# Patient Record
Sex: Female | Born: 1935 | Race: White | Hispanic: No | Smoking: Former smoker
Health system: Southern US, Community
[De-identification: ages and names within clinical notes are randomized; demographics above are authoritative.]

## PROBLEM LIST (undated history)

## (undated) DIAGNOSIS — E039 Hypothyroidism, unspecified: Secondary | ICD-10-CM

## (undated) DIAGNOSIS — I10 Essential (primary) hypertension: Secondary | ICD-10-CM

## (undated) HISTORY — DX: Hypothyroidism, unspecified: E03.9

## (undated) HISTORY — PX: ABDOMINAL HYSTERECTOMY: SHX81

## (undated) HISTORY — DX: Essential (primary) hypertension: I10

---

## 2004-11-27 ENCOUNTER — Ambulatory Visit: Payer: Self-pay | Admitting: Internal Medicine

## 2005-11-29 ENCOUNTER — Ambulatory Visit: Payer: Self-pay | Admitting: Internal Medicine

## 2006-12-05 ENCOUNTER — Ambulatory Visit: Payer: Self-pay | Admitting: Internal Medicine

## 2007-12-11 ENCOUNTER — Ambulatory Visit: Payer: Self-pay | Admitting: Internal Medicine

## 2008-12-23 ENCOUNTER — Ambulatory Visit: Payer: Self-pay | Admitting: Internal Medicine

## 2009-12-27 ENCOUNTER — Ambulatory Visit: Payer: Self-pay | Admitting: Internal Medicine

## 2011-01-18 ENCOUNTER — Ambulatory Visit: Payer: Self-pay | Admitting: Internal Medicine

## 2012-02-18 ENCOUNTER — Ambulatory Visit: Payer: Self-pay | Admitting: Internal Medicine

## 2013-02-18 ENCOUNTER — Ambulatory Visit: Payer: Self-pay | Admitting: Internal Medicine

## 2014-02-19 ENCOUNTER — Ambulatory Visit: Payer: Self-pay | Admitting: Internal Medicine

## 2014-07-30 DIAGNOSIS — Z86018 Personal history of other benign neoplasm: Secondary | ICD-10-CM

## 2014-07-30 HISTORY — DX: Personal history of other benign neoplasm: Z86.018

## 2015-01-28 ENCOUNTER — Other Ambulatory Visit: Payer: Self-pay | Admitting: Internal Medicine

## 2015-01-28 DIAGNOSIS — Z1231 Encounter for screening mammogram for malignant neoplasm of breast: Secondary | ICD-10-CM

## 2015-02-28 ENCOUNTER — Other Ambulatory Visit: Payer: Self-pay | Admitting: Internal Medicine

## 2015-02-28 ENCOUNTER — Ambulatory Visit
Admission: RE | Admit: 2015-02-28 | Discharge: 2015-02-28 | Disposition: A | Discharge status: Home / Self Care | Payer: Medicare Other | Source: Ambulatory Visit | Attending: Internal Medicine | Admitting: Internal Medicine

## 2015-02-28 DIAGNOSIS — Z1231 Encounter for screening mammogram for malignant neoplasm of breast: Secondary | ICD-10-CM

## 2016-03-05 ENCOUNTER — Other Ambulatory Visit: Payer: Self-pay | Admitting: Internal Medicine

## 2016-03-05 DIAGNOSIS — Z1231 Encounter for screening mammogram for malignant neoplasm of breast: Secondary | ICD-10-CM

## 2016-04-09 ENCOUNTER — Other Ambulatory Visit: Payer: Self-pay | Admitting: Internal Medicine

## 2016-04-09 ENCOUNTER — Ambulatory Visit
Admission: RE | Admit: 2016-04-09 | Discharge: 2016-04-09 | Disposition: A | Discharge status: Home / Self Care | Payer: Medicare Other | Source: Ambulatory Visit | Attending: Internal Medicine | Admitting: Internal Medicine

## 2016-04-09 DIAGNOSIS — Z1231 Encounter for screening mammogram for malignant neoplasm of breast: Secondary | ICD-10-CM | POA: Insufficient documentation

## 2017-03-19 ENCOUNTER — Other Ambulatory Visit: Payer: Self-pay | Admitting: Internal Medicine

## 2017-03-19 DIAGNOSIS — Z1231 Encounter for screening mammogram for malignant neoplasm of breast: Secondary | ICD-10-CM

## 2017-04-22 ENCOUNTER — Ambulatory Visit
Admission: RE | Admit: 2017-04-22 | Discharge: 2017-04-22 | Disposition: A | Discharge status: Home / Self Care | Payer: Medicare Other | Source: Ambulatory Visit | Attending: Internal Medicine | Admitting: Internal Medicine

## 2017-04-22 DIAGNOSIS — Z1231 Encounter for screening mammogram for malignant neoplasm of breast: Secondary | ICD-10-CM | POA: Insufficient documentation

## 2017-04-25 ENCOUNTER — Other Ambulatory Visit: Payer: Self-pay | Admitting: Internal Medicine

## 2017-04-25 DIAGNOSIS — N6320 Unspecified lump in the left breast, unspecified quadrant: Secondary | ICD-10-CM

## 2017-04-25 DIAGNOSIS — R928 Other abnormal and inconclusive findings on diagnostic imaging of breast: Secondary | ICD-10-CM

## 2017-04-30 ENCOUNTER — Ambulatory Visit
Admission: RE | Admit: 2017-04-30 | Discharge: 2017-04-30 | Disposition: A | Discharge status: Home / Self Care | Payer: Medicare Other | Source: Ambulatory Visit | Attending: Internal Medicine | Admitting: Internal Medicine

## 2017-04-30 ENCOUNTER — Other Ambulatory Visit: Payer: Self-pay | Admitting: Internal Medicine

## 2017-04-30 DIAGNOSIS — N6320 Unspecified lump in the left breast, unspecified quadrant: Secondary | ICD-10-CM | POA: Diagnosis present

## 2017-04-30 DIAGNOSIS — R928 Other abnormal and inconclusive findings on diagnostic imaging of breast: Secondary | ICD-10-CM

## 2017-10-10 ENCOUNTER — Other Ambulatory Visit: Payer: Self-pay | Admitting: Internal Medicine

## 2017-10-10 DIAGNOSIS — N6320 Unspecified lump in the left breast, unspecified quadrant: Secondary | ICD-10-CM

## 2017-11-13 ENCOUNTER — Ambulatory Visit
Admission: RE | Admit: 2017-11-13 | Discharge: 2017-11-13 | Disposition: A | Discharge status: Home / Self Care | Payer: Medicare Other | Source: Ambulatory Visit | Attending: Internal Medicine | Admitting: Internal Medicine

## 2017-11-13 DIAGNOSIS — N6321 Unspecified lump in the left breast, upper outer quadrant: Secondary | ICD-10-CM | POA: Diagnosis not present

## 2017-11-13 DIAGNOSIS — N6320 Unspecified lump in the left breast, unspecified quadrant: Secondary | ICD-10-CM | POA: Diagnosis present

## 2017-11-14 ENCOUNTER — Other Ambulatory Visit: Payer: Self-pay | Admitting: Internal Medicine

## 2017-11-18 ENCOUNTER — Other Ambulatory Visit: Payer: Self-pay | Admitting: Internal Medicine

## 2017-11-18 DIAGNOSIS — N6320 Unspecified lump in the left breast, unspecified quadrant: Secondary | ICD-10-CM

## 2017-11-18 DIAGNOSIS — R928 Other abnormal and inconclusive findings on diagnostic imaging of breast: Secondary | ICD-10-CM

## 2017-11-20 ENCOUNTER — Ambulatory Visit
Admission: RE | Admit: 2017-11-20 | Discharge: 2017-11-20 | Disposition: A | Discharge status: Home / Self Care | Payer: Medicare Other | Source: Ambulatory Visit | Attending: Internal Medicine | Admitting: Internal Medicine

## 2017-11-20 DIAGNOSIS — N6320 Unspecified lump in the left breast, unspecified quadrant: Secondary | ICD-10-CM

## 2017-11-20 DIAGNOSIS — R928 Other abnormal and inconclusive findings on diagnostic imaging of breast: Secondary | ICD-10-CM | POA: Diagnosis not present

## 2017-11-20 DIAGNOSIS — N6321 Unspecified lump in the left breast, upper outer quadrant: Secondary | ICD-10-CM | POA: Insufficient documentation

## 2017-11-20 DIAGNOSIS — N6092 Unspecified benign mammary dysplasia of left breast: Secondary | ICD-10-CM | POA: Insufficient documentation

## 2017-11-20 HISTORY — PX: BREAST BIOPSY: SHX20

## 2017-11-21 ENCOUNTER — Other Ambulatory Visit: Payer: Self-pay | Admitting: Internal Medicine

## 2017-11-21 DIAGNOSIS — N6320 Unspecified lump in the left breast, unspecified quadrant: Secondary | ICD-10-CM

## 2017-11-21 DIAGNOSIS — R928 Other abnormal and inconclusive findings on diagnostic imaging of breast: Secondary | ICD-10-CM

## 2017-11-22 LAB — SURGICAL PATHOLOGY

## 2017-12-04 ENCOUNTER — Ambulatory Visit
Admission: RE | Admit: 2017-12-04 | Discharge: 2017-12-04 | Disposition: A | Discharge status: Home / Self Care | Payer: Medicare Other | Source: Ambulatory Visit | Attending: Internal Medicine | Admitting: Internal Medicine

## 2017-12-04 DIAGNOSIS — N6320 Unspecified lump in the left breast, unspecified quadrant: Secondary | ICD-10-CM

## 2017-12-04 DIAGNOSIS — R928 Other abnormal and inconclusive findings on diagnostic imaging of breast: Secondary | ICD-10-CM

## 2017-12-04 DIAGNOSIS — D242 Benign neoplasm of left breast: Secondary | ICD-10-CM | POA: Insufficient documentation

## 2017-12-04 HISTORY — PX: BREAST BIOPSY: SHX20

## 2017-12-05 LAB — SURGICAL PATHOLOGY

## 2017-12-10 ENCOUNTER — Encounter: Payer: Self-pay | Admitting: *Deleted

## 2017-12-26 ENCOUNTER — Encounter: Payer: Self-pay | Admitting: General Surgery

## 2017-12-26 ENCOUNTER — Ambulatory Visit: Payer: Medicare Other | Admitting: General Surgery

## 2017-12-26 VITALS — BP 132/74 | HR 76 | Resp 12 | Ht 63.0 in | Wt 164.0 lb

## 2017-12-26 DIAGNOSIS — D242 Benign neoplasm of left breast: Secondary | ICD-10-CM | POA: Diagnosis not present

## 2017-12-26 NOTE — Progress Notes (Signed)
Patient ID: Katie Berg, female   DOB: Jun 23, 1935, 83 y.o.   MRN: [id number]  Chief Complaint  Patient presents with  . Other    breast mass    HPI Katie Berg is a 83 y.o. female who presents for a breast evaluation. The most recent mammogram was done on 11/13/2017 and added views on 11/20/2017 left breast biopsy 12/04/2017. A year ago she had a mammogram and showed something and choice to watch the area.  Patient does perform regular self breast checks and gets regular mammograms done.    HPI  Past Medical History:  Diagnosis Date  . Hypertension   . Hypothyroid     Past Surgical History:  Procedure Laterality Date  . ABDOMINAL HYSTERECTOMY    . BREAST BIOPSY Left   . BREAST BIOPSY Left 12/04/2017   left breast stereo ribbon clip path pending    Family History  Problem Relation Age of Onset  . Cancer Mother   . ALS Mother   . Heart disease Father   . Breast cancer Neg Hx     Social History Social History   Tobacco Use  . Smoking status: Former Smoker    Types: Cigarettes    Last attempt to quit: 10/15/1985    Years since quitting: 32.2  . Smokeless tobacco: Never Used  Substance Use Topics  . Alcohol use: No    Frequency: Never  . Drug use: No    Allergies  Allergen Reactions  . Shellfish Allergy   . Shellfish-Derived Products     Current Outpatient Medications  Medication Sig Dispense Refill  . diltiazem (DILACOR XR) 240 MG 24 hr capsule Take 240 mg by mouth daily.    Marland Kitchen estrogens, conjugated, (PREMARIN) 0.625 MG tablet Take 0.625 mg by mouth daily. Take daily for 21 days then do not take for 7 days.    Marland Kitchen levothyroxine (SYNTHROID, LEVOTHROID) 125 MCG tablet Take 125 mcg by mouth daily before breakfast.    . losartan-hydrochlorothiazide (HYZAAR) 100-12.5 MG tablet Take 1 tablet by mouth daily.    Marland Kitchen omeprazole (PRILOSEC) 20 MG capsule Take 20 mg by mouth daily.    . potassium chloride SA (K-DUR,KLOR-CON) 20 MEQ tablet Take 20 mEq by mouth 2 (two) times daily.      No current facility-administered medications for this visit.     Review of Systems Review of Systems  Blood pressure 132/74, pulse 76, resp. rate 12, height 5\' 3"  (1.6 m), weight 164 lb (74.4 kg).  Physical Exam Physical Exam  Constitutional: She is oriented to person, place, and time. She appears well-developed and well-nourished.  Eyes: Conjunctivae are normal. No scleral icterus.  Neck: Neck supple.  Cardiovascular: Normal rate, regular rhythm and normal heart sounds.  Pulmonary/Chest: Effort normal and breath sounds normal. Right breast exhibits no inverted nipple, no mass, no nipple discharge, no skin change and no tenderness. Left breast exhibits no inverted nipple, no nipple discharge, no skin change and no tenderness.    Lymphadenopathy:    She has no cervical adenopathy.    She has no axillary adenopathy.  Neurological: She is alert and oriented to person, place, and time.  Skin: Skin is warm and dry.    Data Reviewed December 04, 2017 left breast biopsy: DIAGNOSIS:  A. LEFT BREAST, LOWER OUTER QUADRANT; STEREOTACTIC BIOPSY:  - INTRADUCTAL PAPILLOMA, 5 MM.  - FIBROCYSTIC CHANGE.   Ultrasound of the left breast dated November 13, 2017 describes a 0.4 x 0.6 x 0.7 cm hypoechoic lobulated nodule  in the 2 o'clock position 5 cm from the nipple.  BI-RADS-4  Assessment    Small papilloma of the left breast without atypia.    Plan  Options for management reviewed: 1) formal excision versus 2 observation.  With the lesion being 7 mm on ultrasound, 5 mm on biopsy and without atypia, I think observation is very reasonable.  This meets the patient's goals.  The patient has been asked to return to the office in six months with a left breast diagnostic mammogram. The patient is aware to call back for any questions or concerns.   HPI, Physical Exam, Assessment and Plan have been scribed under the direction and in the presence of Hervey Ard, MD.  Gaspar Cola,  CMA  I have completed the exam and reviewed the above documentation for accuracy and completeness.  I agree with the above.  Haematologist has been used and any errors in dictation or transcription are unintentional.  Hervey Ard, M.D., F.A.C.S.  Forest Gleason Byrnett 12/27/2017, 8:19 PM

## 2017-12-26 NOTE — Patient Instructions (Signed)
The patient has been asked to return to the office in six months with a left breast diagnostic mammogram. The patient is aware to call back for any questions or concerns.

## 2017-12-27 DIAGNOSIS — D242 Benign neoplasm of left breast: Secondary | ICD-10-CM | POA: Insufficient documentation

## 2018-04-09 ENCOUNTER — Other Ambulatory Visit: Payer: Self-pay

## 2018-04-09 DIAGNOSIS — D242 Benign neoplasm of left breast: Secondary | ICD-10-CM

## 2018-06-04 ENCOUNTER — Ambulatory Visit
Admission: RE | Admit: 2018-06-04 | Discharge: 2018-06-04 | Disposition: A | Discharge status: Home / Self Care | Payer: Medicare Other | Source: Ambulatory Visit | Attending: General Surgery | Admitting: General Surgery

## 2018-06-04 DIAGNOSIS — D242 Benign neoplasm of left breast: Secondary | ICD-10-CM

## 2018-06-12 ENCOUNTER — Ambulatory Visit: Payer: Medicare Other | Admitting: General Surgery

## 2018-06-12 ENCOUNTER — Encounter: Payer: Self-pay | Admitting: General Surgery

## 2018-06-12 VITALS — BP 124/66 | HR 85 | Resp 18 | Ht 63.0 in | Wt 164.0 lb

## 2018-06-12 DIAGNOSIS — D242 Benign neoplasm of left breast: Secondary | ICD-10-CM | POA: Diagnosis not present

## 2018-06-12 NOTE — Patient Instructions (Addendum)
The patient is aware to call back for any questions or new concerns.  

## 2018-06-12 NOTE — Progress Notes (Signed)
Patient ID: Katie Berg, female   DOB: Feb 15, 1935, 83 y.o.   MRN: [id number]  Chief Complaint  Patient presents with  . Follow-up    HPI Katie Berg is a 83 y.o. female who presents for her follow up breast evaluation. The most recent mammogram was done on 06/04/2018.  The patient was seen in March 2019 after her stereotactic biopsy had identified a papilloma.  We reviewed options for management at that time and elected on observation. Patient does perform regular self breast checks and gets regular mammograms done.  No new breast issues.  HPI  Past Medical History:  Diagnosis Date  . Hypertension   . Hypothyroid     Past Surgical History:  Procedure Laterality Date  . ABDOMINAL HYSTERECTOMY    . BREAST BIOPSY Left 11/20/2017   [place] bx, FOCAL USUAL DUCTAL HYPERPLASIA AND PART OF A DILATED DUCT  . BREAST BIOPSY Left 12/04/2017   left breast stereo ribbon clip, INTRADUCTAL PAPILLOMA    Family History  Problem Relation Age of Onset  . Cancer Mother   . ALS Mother   . Heart disease Father   . Breast cancer Neg Hx     Social History Social History   Tobacco Use  . Smoking status: Former Smoker    Types: Cigarettes    Last attempt to quit: 10/15/1985    Years since quitting: 32.6  . Smokeless tobacco: Never Used  Substance Use Topics  . Alcohol use: No    Frequency: Never  . Drug use: No    Allergies  Allergen Reactions  . Shellfish Allergy   . Shellfish-Derived Products     Current Outpatient Medications  Medication Sig Dispense Refill  . diltiazem (DILACOR XR) 240 MG 24 hr capsule Take 240 mg by mouth daily.    Marland Kitchen estrogens, conjugated, (PREMARIN) 0.625 MG tablet Take 0.625 mg by mouth daily. Take daily for 21 days then do not take for 7 days.    Marland Kitchen levothyroxine (SYNTHROID, LEVOTHROID) 125 MCG tablet Take 125 mcg by mouth daily before breakfast.    . losartan-hydrochlorothiazide (HYZAAR) 100-12.5 MG tablet Take 1 tablet by mouth daily.    Marland Kitchen omeprazole (PRILOSEC) 20  MG capsule Take 20 mg by mouth daily.    . potassium chloride SA (K-DUR,KLOR-CON) 20 MEQ tablet Take 20 mEq by mouth 2 (two) times daily.     No current facility-administered medications for this visit.     Review of Systems Review of Systems  Constitutional: Negative.   Respiratory: Negative.   Cardiovascular: Negative.     Blood pressure 124/66, pulse 85, resp. rate 18, height 5\' 3"  (1.6 m), weight 164 lb (74.4 kg), SpO2 96 %.  Physical Exam Physical Exam  Constitutional: She is oriented to person, place, and time. She appears well-developed and well-nourished.  Eyes: Conjunctivae are normal. No scleral icterus.  Neck: Neck supple.  Cardiovascular: Normal rate, regular rhythm and normal heart sounds.  Pulmonary/Chest: Effort normal and breath sounds normal. Right breast exhibits no inverted nipple, no mass, no nipple discharge, no skin change and no tenderness. Left breast exhibits no inverted nipple, no mass, no nipple discharge, no skin change and no tenderness.  Lymphadenopathy:    She has no cervical adenopathy.    She has no axillary adenopathy.  Neurological: She is alert and oriented to person, place, and time.  Skin: Skin is warm and dry.    Data Reviewed Bilateral diagnostic mammograms dated June 04, 2018 revealed no interval change at the site  of prior biopsy.  BI-RADS-2.  Screening exam in 1 year recommended.  Assessment    Denying breast exam.    Plan  Follow up as needed.   Continue annual screening mammograms with her PCP.  (Due August 2020). The patient is aware to call back for any questions or concerns.    HPI, Physical Exam, Assessment and Plan have been scribed under the direction and in the presence of Robert Bellow, MD. [name], RN  Katie Berg 06/13/2018, 7:05 AM

## 2019-04-21 IMAGING — MG MM DIGITAL DIAGNOSTIC UNILAT*L* W/ TOMO W/ CAD
5 series · 6 of 13 positions shown · non-contrast
Comparison: Previous exam(s).

CLINICAL DATA: Follow-up of probably benign left breast 2 o'clock
nodule.

EXAM:
2D DIGITAL DIAGNOSTIC LEFT MAMMOGRAM WITH CAD AND ADJUNCT TOMO
ULTRASOUND LEFT BREAST

[L MLO synth-2D]
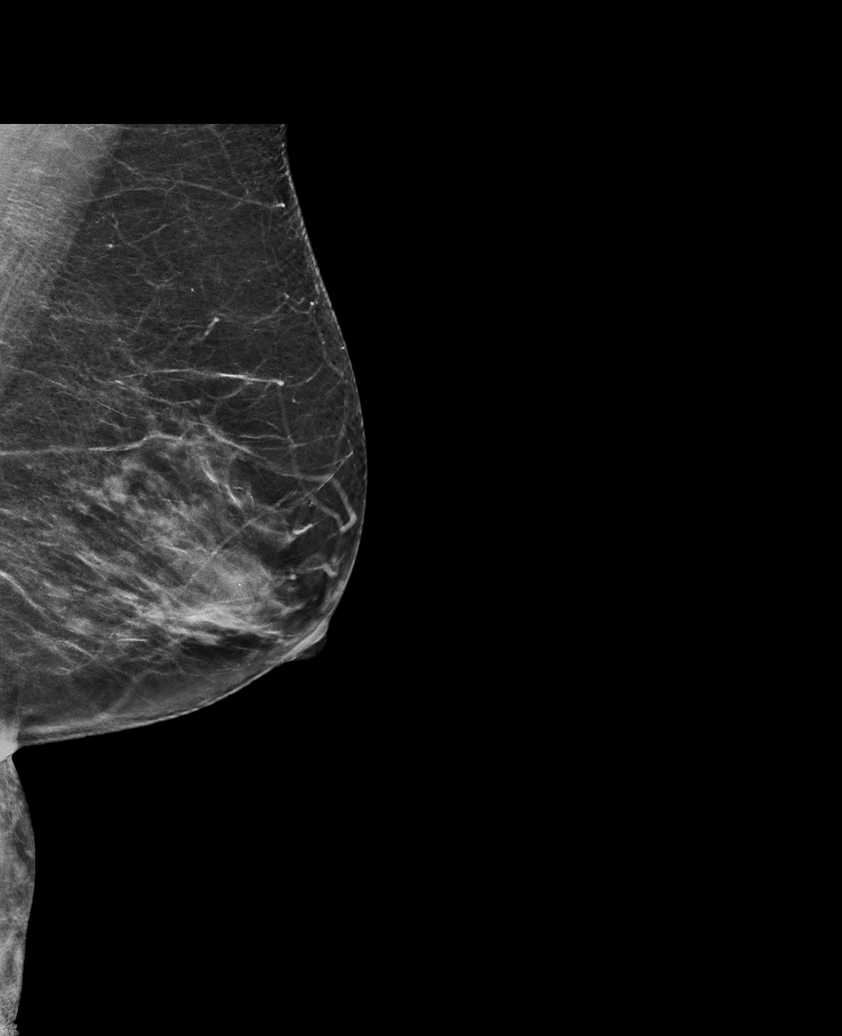

[L CC synth-2D]
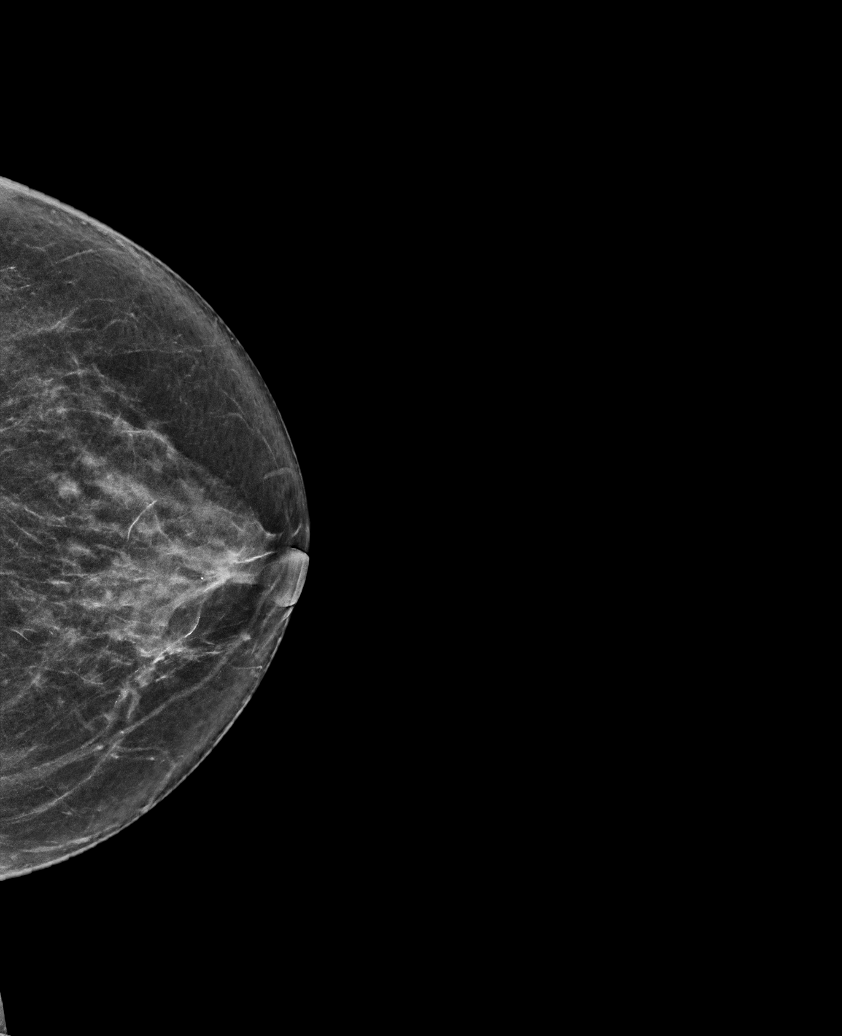

[L MLO]
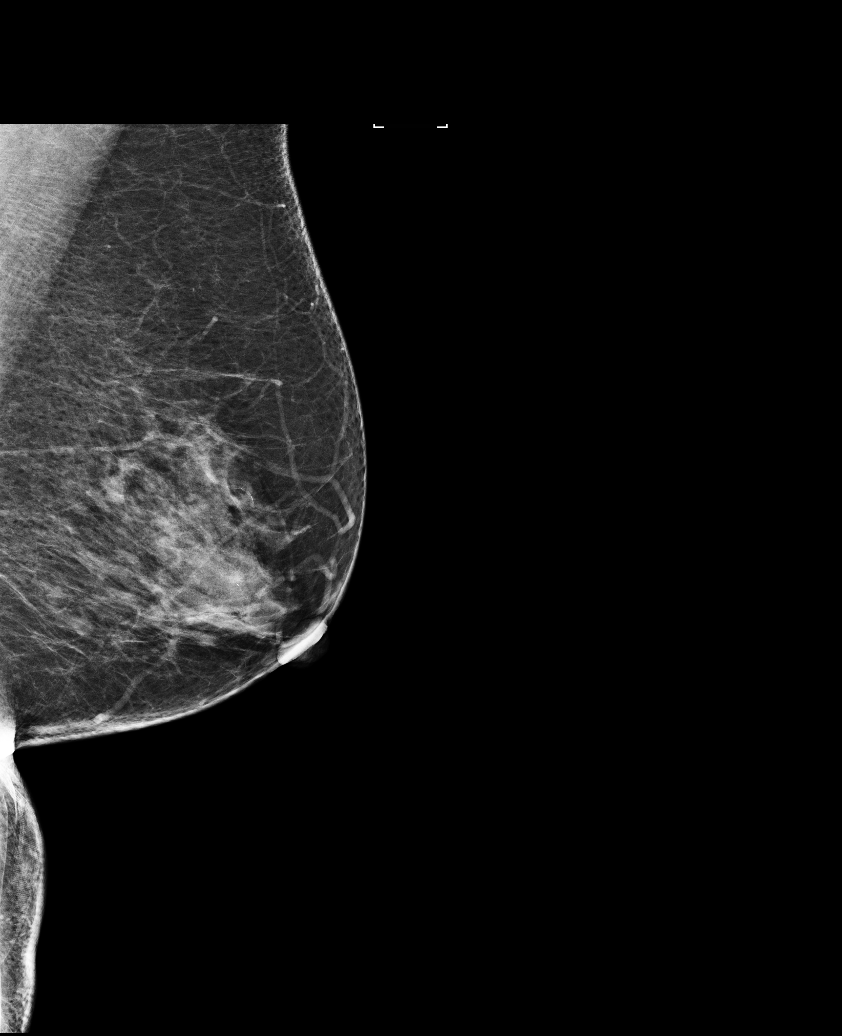

[L MLO tomo · 2 of 66 frames shown]
[frame 22/66]
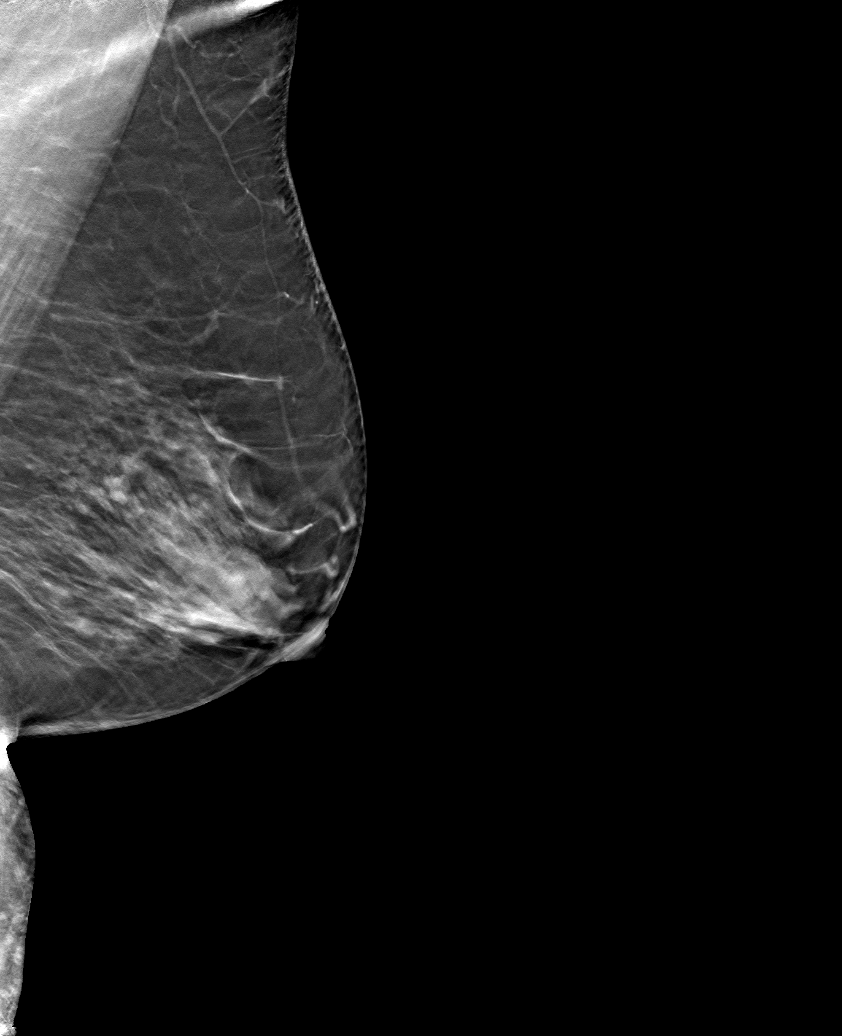
[frame 33/66]
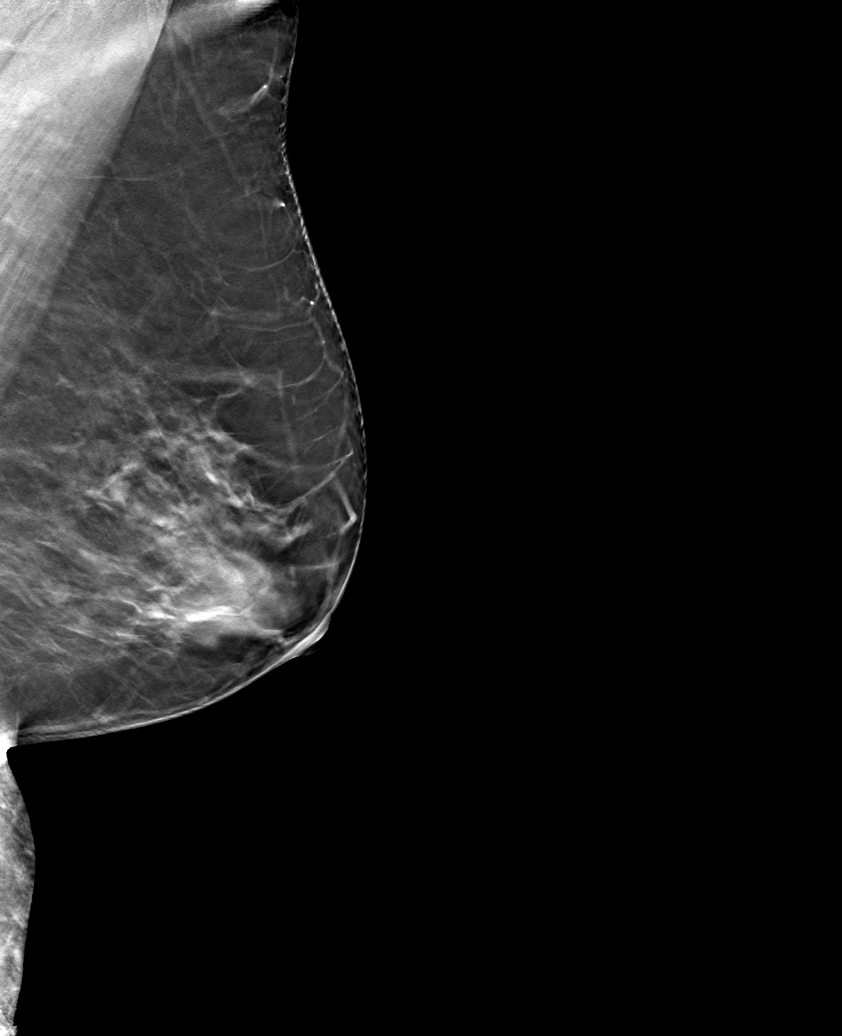

[L CC tomo · tomo slice 35/68.0]
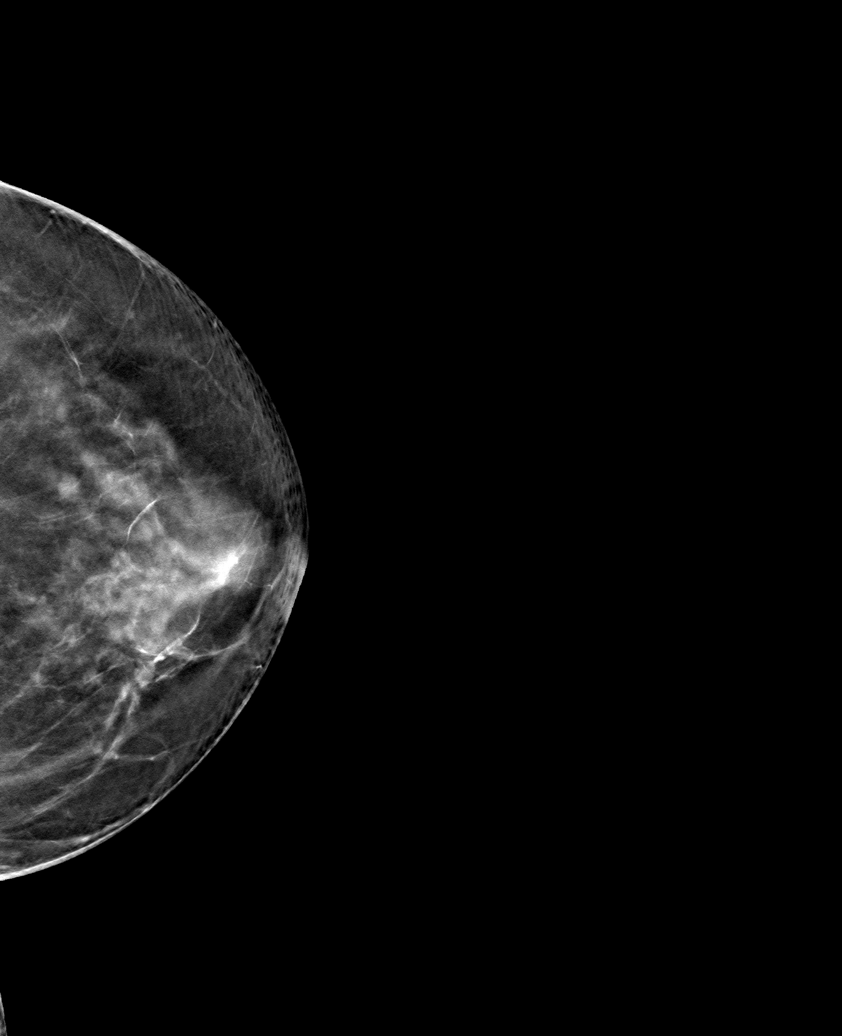

[6 of 13 positions shown; findings below may reference images not displayed]

ACR Breast Density Category c: The breast tissue is heterogeneously
dense, which may obscure small masses.
FINDINGS: Mammographically, there is a persistent lobulated hyperdense to
breast parenchyma nodule in the left breast slightly upper outer
quadrant, posterior depth, not significantly changed in size from
the prior mammogram.

Mammographic images were processed with CAD.

On physical exam, no suspicious masses are palpated.

Targeted ultrasound is performed, showing left breast 2 o'clock 5 cm
from the nipple slightly irregular hypoechoic lobulated nodule which
measures 0.6 x 0.7 x 0.4 cm. This finding likely corresponds to the
mammographically seen nodule, however it has indeterminate features
sonographically. There is no evidence of left axillary
lymphadenopathy.
IMPRESSION: Left breast 2 o'clock indeterminate nodule, for which
ultrasound-guided core needle biopsy is recommended.

RECOMMENDATION:
Ultrasound-guided core needle biopsy of the left breast.

I have discussed the findings and recommendations with the patient.
Results were also provided in writing at the conclusion of the
visit. If applicable, a reminder letter will be sent to the patient
regarding the next appointment.

BI-RADS CATEGORY  4: Suspicious.

## 2019-04-23 ENCOUNTER — Other Ambulatory Visit: Payer: Self-pay | Admitting: Internal Medicine

## 2019-04-23 DIAGNOSIS — Z1231 Encounter for screening mammogram for malignant neoplasm of breast: Secondary | ICD-10-CM

## 2019-07-13 ENCOUNTER — Ambulatory Visit
Admission: RE | Admit: 2019-07-13 | Discharge: 2019-07-13 | Disposition: A | Discharge status: Home / Self Care | Payer: Medicare Other | Source: Ambulatory Visit | Attending: Internal Medicine | Admitting: Internal Medicine

## 2019-07-13 DIAGNOSIS — Z1231 Encounter for screening mammogram for malignant neoplasm of breast: Secondary | ICD-10-CM | POA: Diagnosis present

## 2019-07-14 ENCOUNTER — Other Ambulatory Visit: Payer: Self-pay | Admitting: Internal Medicine

## 2019-07-14 DIAGNOSIS — N6489 Other specified disorders of breast: Secondary | ICD-10-CM

## 2019-07-14 DIAGNOSIS — R928 Other abnormal and inconclusive findings on diagnostic imaging of breast: Secondary | ICD-10-CM

## 2019-07-21 ENCOUNTER — Ambulatory Visit
Admission: RE | Admit: 2019-07-21 | Discharge: 2019-07-21 | Disposition: A | Discharge status: Home / Self Care | Payer: Medicare Other | Source: Ambulatory Visit | Attending: Internal Medicine | Admitting: Internal Medicine

## 2019-07-21 DIAGNOSIS — N6489 Other specified disorders of breast: Secondary | ICD-10-CM | POA: Diagnosis present

## 2019-07-21 DIAGNOSIS — R928 Other abnormal and inconclusive findings on diagnostic imaging of breast: Secondary | ICD-10-CM

## 2019-10-12 ENCOUNTER — Ambulatory Visit: Payer: Medicare Other | Attending: Internal Medicine

## 2019-10-12 ENCOUNTER — Other Ambulatory Visit: Payer: Self-pay

## 2019-10-12 DIAGNOSIS — Z20822 Contact with and (suspected) exposure to covid-19: Secondary | ICD-10-CM

## 2019-10-13 LAB — NOVEL CORONAVIRUS, NAA: SARS-CoV-2, NAA: NOT DETECTED

## 2020-06-06 ENCOUNTER — Other Ambulatory Visit: Payer: Self-pay | Admitting: Internal Medicine

## 2020-06-06 DIAGNOSIS — Z1231 Encounter for screening mammogram for malignant neoplasm of breast: Secondary | ICD-10-CM

## 2020-07-13 ENCOUNTER — Ambulatory Visit
Admission: RE | Admit: 2020-07-13 | Discharge: 2020-07-13 | Disposition: A | Discharge status: Home / Self Care | Payer: Medicare PPO | Source: Ambulatory Visit | Attending: Internal Medicine | Admitting: Internal Medicine

## 2020-07-13 DIAGNOSIS — Z1231 Encounter for screening mammogram for malignant neoplasm of breast: Secondary | ICD-10-CM | POA: Diagnosis present

## 2020-07-20 ENCOUNTER — Ambulatory Visit: Payer: Medicare PPO | Admitting: Dermatology

## 2020-07-20 ENCOUNTER — Other Ambulatory Visit: Payer: Self-pay

## 2020-07-20 DIAGNOSIS — L820 Inflamed seborrheic keratosis: Secondary | ICD-10-CM

## 2020-07-20 DIAGNOSIS — L821 Other seborrheic keratosis: Secondary | ICD-10-CM

## 2020-07-20 DIAGNOSIS — B353 Tinea pedis: Secondary | ICD-10-CM | POA: Diagnosis not present

## 2020-07-20 DIAGNOSIS — L814 Other melanin hyperpigmentation: Secondary | ICD-10-CM

## 2020-07-20 DIAGNOSIS — Z1283 Encounter for screening for malignant neoplasm of skin: Secondary | ICD-10-CM | POA: Diagnosis not present

## 2020-07-20 DIAGNOSIS — D1800 Hemangioma unspecified site: Secondary | ICD-10-CM

## 2020-07-20 DIAGNOSIS — L570 Actinic keratosis: Secondary | ICD-10-CM | POA: Diagnosis not present

## 2020-07-20 DIAGNOSIS — D229 Melanocytic nevi, unspecified: Secondary | ICD-10-CM

## 2020-07-20 DIAGNOSIS — Z86018 Personal history of other benign neoplasm: Secondary | ICD-10-CM

## 2020-07-20 DIAGNOSIS — L578 Other skin changes due to chronic exposure to nonionizing radiation: Secondary | ICD-10-CM

## 2020-07-20 MED ORDER — KETOCONAZOLE 2 % EX CREA: TOPICAL_CREAM | CUTANEOUS | 6 refills | Status: DC

## 2020-07-20 NOTE — Progress Notes (Signed)
Follow-Up Visit   Subjective  Katie Berg is a 85 y.o. female who presents for the following: Annual Exam (Hx dysplastic nevi, AK's ). The patient presents for Total-Body Skin Exam (TBSE) for skin cancer screening and mole check.  The following portions of the chart were reviewed this encounter and updated as appropriate:  Tobacco   Allergies   Meds   Problems   Med Hx   Surg Hx   Fam Hx      Review of Systems:  No other skin or systemic complaints except as noted in HPI or Assessment and Plan.  Objective  Well appearing patient in no apparent distress; mood and affect are within normal limits.  A full examination was performed including scalp, head, eyes, ears, nose, lips, neck, chest, axillae, abdomen, back, buttocks, bilateral upper extremities, bilateral lower extremities, hands, feet, fingers, toes, fingernails, and toenails. All findings within normal limits unless otherwise noted below.  Objective  Face, L nose x 1 (2): Erythematous thin papules/macules with gritty scale.   Objective  L mandible x 1, chest x 16 (17): Erythematous keratotic or waxy stuck-on papule or plaque.   Objective  B/L foot: Scaling and maceration web spaces and over distal and lateral soles.   Assessment & Plan  AK (actinic keratosis) (2) Face, L nose x 1  Destruction of lesion - Face, L nose x 1 Complexity: simple   Destruction method: cryotherapy   Informed consent: discussed and consent obtained   Timeout:  patient name, date of birth, surgical site, and procedure verified Lesion destroyed using liquid nitrogen: Yes   Region frozen until ice ball extended beyond lesion: Yes   Outcome: patient tolerated procedure well with no complications   Post-procedure details: wound care instructions given    Inflamed seborrheic keratosis (17) L mandible x 1, chest x 16  Destruction of lesion - L mandible x 1, chest x 16 Complexity: simple   Destruction method: cryotherapy   Informed consent:  discussed and consent obtained   Timeout:  patient name, date of birth, surgical site, and procedure verified Lesion destroyed using liquid nitrogen: Yes   Region frozen until ice ball extended beyond lesion: Yes   Outcome: patient tolerated procedure well with no complications   Post-procedure details: wound care instructions given    Tinea pedis of both feet B/L foot Start Ketoconazole 2% cream QHS to the feet.  ketoconazole (NIZORAL) 2 % cream - B/L foot   Lentigines - Scattered tan macules - Discussed due to sun exposure - Benign, observe - Call for any changes  Seborrheic Keratoses - Stuck-on, waxy, tan-brown papules and plaques  - Discussed benign etiology and prognosis. - Observe - Call for any changes  Melanocytic Nevi - Tan-brown and/or pink-flesh-colored symmetric macules and papules - Benign appearing on exam today - Observation - Call clinic for new or changing moles - Recommend daily use of broad spectrum spf 30+ sunscreen to sun-exposed areas.   Hemangiomas - Red papules - Discussed benign nature - Observe - Call for any changes  Actinic Damage - diffuse scaly erythematous macules with underlying dyspigmentation - Recommend daily broad spectrum sunscreen SPF 30+ to sun-exposed areas, reapply every 2 hours as needed.  - Call for new or changing lesions.  History of Dysplastic Nevi - No evidence of recurrence today - Recommend regular full body skin exams - Recommend daily broad spectrum sunscreen SPF 30+ to sun-exposed areas, reapply every 2 hours as needed.  - Call if any new or  changing lesions are noted between office visits  Skin cancer screening performed today.  Return in about 1 year (around 07/20/2021) for TBSE - hx dysplastic nevi, AK's .  Luther Redo, CMA, am acting as scribe for Sarina Ser, MD .  Documentation: I have reviewed the above documentation for accuracy and completeness, and I agree with the above.  Sarina Ser,  MD

## 2020-07-21 ENCOUNTER — Encounter: Payer: Self-pay | Admitting: Dermatology

## 2020-09-01 ENCOUNTER — Encounter: Payer: Medicare Other | Admitting: Dermatology

## 2021-06-08 ENCOUNTER — Other Ambulatory Visit: Payer: Self-pay | Admitting: Otolaryngology

## 2021-06-08 DIAGNOSIS — E041 Nontoxic single thyroid nodule: Secondary | ICD-10-CM

## 2021-06-29 ENCOUNTER — Other Ambulatory Visit: Payer: Self-pay

## 2021-06-29 ENCOUNTER — Ambulatory Visit
Admission: RE | Admit: 2021-06-29 | Discharge: 2021-06-29 | Disposition: A | Discharge status: Home / Self Care | Payer: Medicare PPO | Source: Ambulatory Visit | Attending: Otolaryngology | Admitting: Otolaryngology

## 2021-06-29 DIAGNOSIS — E041 Nontoxic single thyroid nodule: Secondary | ICD-10-CM | POA: Diagnosis not present

## 2021-07-26 ENCOUNTER — Encounter: Payer: Medicare PPO | Admitting: Dermatology

## 2021-09-14 ENCOUNTER — Other Ambulatory Visit: Payer: Self-pay

## 2021-09-14 ENCOUNTER — Ambulatory Visit (INDEPENDENT_AMBULATORY_CARE_PROVIDER_SITE_OTHER): Payer: Medicare PPO | Admitting: Dermatology

## 2021-09-14 DIAGNOSIS — D229 Melanocytic nevi, unspecified: Secondary | ICD-10-CM

## 2021-09-14 DIAGNOSIS — B353 Tinea pedis: Secondary | ICD-10-CM

## 2021-09-14 DIAGNOSIS — Z86018 Personal history of other benign neoplasm: Secondary | ICD-10-CM

## 2021-09-14 DIAGNOSIS — L570 Actinic keratosis: Secondary | ICD-10-CM | POA: Diagnosis not present

## 2021-09-14 DIAGNOSIS — Z1283 Encounter for screening for malignant neoplasm of skin: Secondary | ICD-10-CM | POA: Diagnosis not present

## 2021-09-14 DIAGNOSIS — L578 Other skin changes due to chronic exposure to nonionizing radiation: Secondary | ICD-10-CM | POA: Diagnosis not present

## 2021-09-14 DIAGNOSIS — L820 Inflamed seborrheic keratosis: Secondary | ICD-10-CM | POA: Diagnosis not present

## 2021-09-14 DIAGNOSIS — L814 Other melanin hyperpigmentation: Secondary | ICD-10-CM

## 2021-09-14 DIAGNOSIS — D1800 Hemangioma unspecified site: Secondary | ICD-10-CM

## 2021-09-14 DIAGNOSIS — D692 Other nonthrombocytopenic purpura: Secondary | ICD-10-CM

## 2021-09-14 DIAGNOSIS — L821 Other seborrheic keratosis: Secondary | ICD-10-CM

## 2021-09-14 MED ORDER — KETOCONAZOLE 2 % EX CREA: TOPICAL_CREAM | CUTANEOUS | 11 refills | Status: AC

## 2021-09-14 NOTE — Progress Notes (Signed)
Follow-Up Visit   Subjective  Katie Berg is a 86 y.o. female who presents for the following: Total body skin exam (Hx of Dysplastic nevus R mid medial buttock, hx of AKs) and check spots (Face, neck, abdomen, irritating). The patient presents for Total-Body Skin Exam (TBSE) for skin cancer screening and mole check. The patient has spots, moles and lesions to be evaluated, some may be new or changing and the patient has concerns that these could be cancer.  The following portions of the chart were reviewed this encounter and updated as appropriate:   Tobacco  Allergies  Meds  Problems  Med Hx  Surg Hx  Fam Hx     Review of Systems:  No other skin or systemic complaints except as noted in HPI or Assessment and Plan.  Objective  Well appearing patient in no apparent distress; mood and affect are within normal limits.  A full examination was performed including scalp, head, eyes, ears, nose, lips, neck, chest, axillae, abdomen, back, buttocks, bilateral upper extremities, bilateral lower extremities, hands, feet, fingers, toes, fingernails, and toenails. All findings within normal limits unless otherwise noted below.  R mid medial buttocks Scar with no evidence of recurrence.   mid forehead x 1, upper back spinal x 1, L back x 1, L abdomen x 1, Total = 4 (4) Erythematous keratotic or waxy stuck-on papule or plaque.   L face x 9 (9) Pink scaly macules   bil feet Peeling bil feet   Assessment & Plan   Lentigines - Scattered tan macules - Due to sun exposure - Benign-appearing, observe - Recommend daily broad spectrum sunscreen SPF 30+ to sun-exposed areas, reapply every 2 hours as needed. - Call for any changes  Seborrheic Keratoses - Stuck-on, waxy, tan-brown papules and/or plaques  - Benign-appearing - Discussed benign etiology and prognosis. - Observe - Call for any changes  Melanocytic Nevi - Tan-brown and/or pink-flesh-colored symmetric macules and  papules - Benign appearing on exam today - Observation - Call clinic for new or changing moles - Recommend daily use of broad spectrum spf 30+ sunscreen to sun-exposed areas.   Hemangiomas - Red papules - Discussed benign nature - Observe - Call for any changes  Actinic Damage - Chronic condition, secondary to cumulative UV/sun exposure - diffuse scaly erythematous macules with underlying dyspigmentation - Recommend daily broad spectrum sunscreen SPF 30+ to sun-exposed areas, reapply every 2 hours as needed.  - Staying in the shade or wearing long sleeves, sun glasses (UVA+UVB protection) and wide brim hats (4-inch brim around the entire circumference of the hat) are also recommended for sun protection.  - Call for new or changing lesions.  Skin cancer screening performed today.  History of dysplastic nevus R mid medial buttocks Clear. Observe for recurrence. Call clinic for new or changing lesions.  Recommend regular skin exams, daily broad-spectrum spf 30+ sunscreen use, and photoprotection.    Inflamed seborrheic keratosis mid forehead x 1, upper back spinal x 1, L back x 1, L abdomen x 1, Total = 4 Destruction of lesion - mid forehead x 1, upper back spinal x 1, L back x 1, L abdomen x 1, Total = 4 Complexity: simple   Destruction method: cryotherapy   Informed consent: discussed and consent obtained   Timeout:  patient name, date of birth, surgical site, and procedure verified Lesion destroyed using liquid nitrogen: Yes   Region frozen until ice ball extended beyond lesion: Yes   Outcome: patient tolerated procedure well  with no complications   Post-procedure details: wound care instructions given    AK (actinic keratosis) (9) L face x 9 Destruction of lesion - L face x 9 Complexity: simple   Destruction method: cryotherapy   Informed consent: discussed and consent obtained   Timeout:  patient name, date of birth, surgical site, and procedure verified Lesion destroyed  using liquid nitrogen: Yes   Region frozen until ice ball extended beyond lesion: Yes   Outcome: patient tolerated procedure well with no complications   Post-procedure details: wound care instructions given    Tinea pedis of both feet bil feet Chronic, persistent Ketoconazole 2% cr qhs Related Medications ketoconazole (NIZORAL) 2 % cream Apply to the feet and between the toes QHS  Skin cancer screening  Purpura - Chronic; persistent and recurrent.  Treatable, but not curable. - Violaceous macules and patches - Benign - Related to trauma, age, sun damage and/or use of blood thinners, chronic use of topical and/or oral steroids - Observe - Can use OTC arnica containing moisturizer such as Dermend Bruise Formula if desired - Call for worsening or other concerns  Return in about 1 year (around 09/14/2022) for TBSE, Hx of Dysplastic nevi, Hx of AKs.  I, Othelia Pulling, RMA, am acting as scribe for Sarina Ser, MD . Documentation: I have reviewed the above documentation for accuracy and completeness, and I agree with the above.  Sarina Ser, MD

## 2021-09-14 NOTE — Patient Instructions (Addendum)
If You Need Anything After Your Visit  If you have any questions or concerns for your doctor, please call our main line at [phone] and press option 4 to reach your doctor's medical assistant. If no one answers, please leave a voicemail as directed and we will return your call as soon as possible. Messages left after 4 pm will be answered the following business day.   You may also send us a message via MyChart. We typically respond to MyChart messages within 1-2 business days.  For prescription refills, please ask your pharmacy to contact our office. Our fax number is [phone].  If you have an urgent issue when the clinic is closed that cannot wait until the next business day, you can page your doctor at the number below.    Please note that while we do our best to be available for urgent issues outside of office hours, we are not available 24/7.   If you have an urgent issue and are unable to reach us, you may choose to seek medical care at your doctor's office, retail clinic, urgent care center, or emergency room.  If you have a medical emergency, please immediately call 911 or go to the emergency department.  Pager Numbers  - Dr. Kowalski: [phone]  - Dr. Moye: [phone]  - Dr. Stewart: [phone]  In the event of inclement weather, please call our main line at [phone] for an update on the status of any delays or closures.  Dermatology Medication Tips: Please keep the boxes that topical medications come in in order to help keep track of the instructions about where and how to use these. Pharmacies typically print the medication instructions only on the boxes and not directly on the medication tubes.   If your medication is too expensive, please contact our office at [phone] option 4 or send us a message through MyChart.   We are unable to tell what your co-pay for medications will be in advance as this is different depending on your insurance coverage.  However, we may be able to find a substitute medication at lower cost or fill out paperwork to get insurance to cover a needed medication.   If a prior authorization is required to get your medication covered by your insurance company, please allow us 1-2 business days to complete this process.  Drug prices often vary depending on where the prescription is filled and some pharmacies may offer cheaper prices.  The website www.goodrx.com contains coupons for medications through different pharmacies. The prices here do not account for what the cost may be with help from insurance (it may be cheaper with your insurance), but the website can give you the price if you did not use any insurance.  - You can print the associated coupon and take it with your prescription to the pharmacy.  - You may also stop by our office during regular business hours and pick up a GoodRx coupon card.  - If you need your prescription sent electronically to a different pharmacy, notify our office through [facility] MyChart or by phone at [phone] option 4.     Si Usted Necesita Algo Despus de Su Visita  Tambin puede enviarnos un mensaje a travs de MyChart. Por lo general respondemos a los mensajes de MyChart en el transcurso de 1 a 2 das hbiles.  Para renovar recetas, por favor pida a su farmacia que se ponga en contacto con nuestra oficina. Nuestro nmero de fax es el [phone].  Si tiene   un asunto urgente cuando la clnica est cerrada y que no puede esperar hasta el siguiente da hbil, puede llamar/localizar a su doctor(a) al nmero que aparece a continuacin.   Por favor, tenga en cuenta que aunque hacemos todo lo posible para estar disponibles para asuntos urgentes fuera del horario de oficina, no estamos disponibles las 24 horas del da, los 7 das de la semana.   Si tiene un problema urgente y no puede comunicarse con nosotros, puede optar por buscar atencin mdica  en el consultorio de su  doctor(a), en una clnica privada, en un centro de atencin urgente o en una sala de emergencias.  Si tiene una emergencia mdica, por favor llame inmediatamente al 911 o vaya a la sala de emergencias.  Nmeros de bper  - Dr. Kowalski: [phone]  - Dra. Moye: [phone]  - Dra. Stewart: [phone]  En caso de inclemencias del tiempo, por favor llame a nuestra lnea principal al [phone] para una actualizacin sobre el estado de cualquier retraso o cierre.  Consejos para la medicacin en dermatologa: Por favor, guarde las cajas en las que vienen los medicamentos de uso tpico para ayudarle a seguir las instrucciones sobre dnde y cmo usarlos. Las farmacias generalmente imprimen las instrucciones del medicamento slo en las cajas y no directamente en los tubos del medicamento.   Si su medicamento es muy caro, por favor, pngase en contacto con nuestra oficina llamando al [phone] y presione la opcin 4 o envenos un mensaje a travs de MyChart.   No podemos decirle cul ser su copago por los medicamentos por adelantado ya que esto es diferente dependiendo de la cobertura de su seguro. Sin embargo, es posible que podamos encontrar un medicamento sustituto a menor costo o llenar un formulario para que el seguro cubra el medicamento que se considera necesario.   Si se requiere una autorizacin previa para que su compaa de seguros cubra su medicamento, por favor permtanos de 1 a 2 das hbiles para completar este proceso.  Los precios de los medicamentos varan con frecuencia dependiendo del lugar de dnde se surte la receta y alguna farmacias pueden ofrecer precios ms baratos.  El sitio web www.goodrx.com tiene cupones para medicamentos de diferentes farmacias. Los precios aqu no tienen en cuenta lo que podra costar con la ayuda del seguro (puede ser ms barato con su seguro), pero el sitio web puede darle el precio si no utiliz ningn seguro.  - Puede imprimir el cupn  correspondiente y llevarlo con su receta a la farmacia.  - Tambin puede pasar por nuestra oficina durante el horario de atencin regular y recoger una tarjeta de cupones de GoodRx.  - Si necesita que su receta se enve electrnicamente a una farmacia diferente, informe a nuestra oficina a travs de MyChart de  o por telfono llamando al [phone] y presione la opcin 4.   Cryotherapy Aftercare  Wash gently with soap and water everyday.   Apply Vaseline and Band-Aid daily until healed.  

## 2021-09-22 ENCOUNTER — Encounter: Payer: Self-pay | Admitting: Dermatology

## 2022-09-20 ENCOUNTER — Ambulatory Visit: Payer: Medicare PPO | Admitting: Dermatology

## 2022-09-20 VITALS — BP 121/71 | HR 86

## 2022-09-20 DIAGNOSIS — L578 Other skin changes due to chronic exposure to nonionizing radiation: Secondary | ICD-10-CM | POA: Diagnosis not present

## 2022-09-20 DIAGNOSIS — D229 Melanocytic nevi, unspecified: Secondary | ICD-10-CM

## 2022-09-20 DIAGNOSIS — L814 Other melanin hyperpigmentation: Secondary | ICD-10-CM

## 2022-09-20 DIAGNOSIS — L570 Actinic keratosis: Secondary | ICD-10-CM

## 2022-09-20 DIAGNOSIS — D225 Melanocytic nevi of trunk: Secondary | ICD-10-CM

## 2022-09-20 DIAGNOSIS — L821 Other seborrheic keratosis: Secondary | ICD-10-CM

## 2022-09-20 DIAGNOSIS — L820 Inflamed seborrheic keratosis: Secondary | ICD-10-CM | POA: Diagnosis not present

## 2022-09-20 DIAGNOSIS — Z86018 Personal history of other benign neoplasm: Secondary | ICD-10-CM

## 2022-09-20 DIAGNOSIS — Z1283 Encounter for screening for malignant neoplasm of skin: Secondary | ICD-10-CM

## 2022-09-20 NOTE — Patient Instructions (Addendum)
Cryotherapy Aftercare  Wash gently with soap and water everyday.   Apply Vaseline and Band-Aid daily until healed.     Due to recent changes in healthcare laws, you may see results of your pathology and/or laboratory studies on MyChart before the doctors have had a chance to review them. We understand that in some cases there may be results that are confusing or concerning to you. Please understand that not all results are received at the same time and often the doctors may need to interpret multiple results in order to provide you with the best plan of care or course of treatment. Therefore, we ask that you please give us 2 business days to thoroughly review all your results before contacting the office for clarification. Should we see a critical lab result, you will be contacted sooner.   If You Need Anything After Your Visit  If you have any questions or concerns for your doctor, please call our main line at [phone] and press option 4 to reach your doctor's medical assistant. If no one answers, please leave a voicemail as directed and we will return your call as soon as possible. Messages left after 4 pm will be answered the following business day.   You may also send us a message via MyChart. We typically respond to MyChart messages within 1-2 business days.  For prescription refills, please ask your pharmacy to contact our office. Our fax number is [phone].  If you have an urgent issue when the clinic is closed that cannot wait until the next business day, you can page your doctor at the number below.    Please note that while we do our best to be available for urgent issues outside of office hours, we are not available 24/7.   If you have an urgent issue and are unable to reach us, you may choose to seek medical care at your doctor's office, retail clinic, urgent care center, or emergency room.  If you have a medical emergency, please immediately call 911 or go to the  emergency department.  Pager Numbers  - Dr. Kowalski: [phone]  - Dr. Moye: [phone]  - Dr. Stewart: [phone]  In the event of inclement weather, please call our main line at [phone] for an update on the status of any delays or closures.  Dermatology Medication Tips: Please keep the boxes that topical medications come in in order to help keep track of the instructions about where and how to use these. Pharmacies typically print the medication instructions only on the boxes and not directly on the medication tubes.   If your medication is too expensive, please contact our office at [phone] option 4 or send us a message through MyChart.   We are unable to tell what your co-pay for medications will be in advance as this is different depending on your insurance coverage. However, we may be able to find a substitute medication at lower cost or fill out paperwork to get insurance to cover a needed medication.   If a prior authorization is required to get your medication covered by your insurance company, please allow us 1-2 business days to complete this process.  Drug prices often vary depending on where the prescription is filled and some pharmacies may offer cheaper prices.  The website www.goodrx.com contains coupons for medications through different pharmacies. The prices here do not account for what the cost may be with help from insurance (it may be cheaper with your insurance), but the website can   give you the price if you did not use any insurance.  - You can print the associated coupon and take it with your prescription to the pharmacy.  - You may also stop by our office during regular business hours and pick up a GoodRx coupon card.  - If you need your prescription sent electronically to a different pharmacy, notify our office through [facility] MyChart or by phone at [phone] option 4.     Si Usted Necesita Algo Despus de Su Visita  Tambin puede  enviarnos un mensaje a travs de MyChart. Por lo general respondemos a los mensajes de MyChart en el transcurso de 1 a 2 das hbiles.  Para renovar recetas, por favor pida a su farmacia que se ponga en contacto con nuestra oficina. Nuestro nmero de fax es el [phone].  Si tiene un asunto urgente cuando la clnica est cerrada y que no puede esperar hasta el siguiente da hbil, puede llamar/localizar a su doctor(a) al nmero que aparece a continuacin.   Por favor, tenga en cuenta que aunque hacemos todo lo posible para estar disponibles para asuntos urgentes fuera del horario de oficina, no estamos disponibles las 24 horas del da, los 7 das de la semana.   Si tiene un problema urgente y no puede comunicarse con nosotros, puede optar por buscar atencin mdica  en el consultorio de su doctor(a), en una clnica privada, en un centro de atencin urgente o en una sala de emergencias.  Si tiene una emergencia mdica, por favor llame inmediatamente al 911 o vaya a la sala de emergencias.  Nmeros de bper  - Dr. Kowalski: [phone]  - Dra. Moye: [phone]  - Dra. Stewart: [phone]  En caso de inclemencias del tiempo, por favor llame a nuestra lnea principal al [phone] para una actualizacin sobre el estado de cualquier retraso o cierre.  Consejos para la medicacin en dermatologa: Por favor, guarde las cajas en las que vienen los medicamentos de uso tpico para ayudarle a seguir las instrucciones sobre dnde y cmo usarlos. Las farmacias generalmente imprimen las instrucciones del medicamento slo en las cajas y no directamente en los tubos del medicamento.   Si su medicamento es muy caro, por favor, pngase en contacto con nuestra oficina llamando al [phone] y presione la opcin 4 o envenos un mensaje a travs de MyChart.   No podemos decirle cul ser su copago por los medicamentos por adelantado ya que esto es diferente dependiendo de la cobertura de su seguro.  Sin embargo, es posible que podamos encontrar un medicamento sustituto a menor costo o llenar un formulario para que el seguro cubra el medicamento que se considera necesario.   Si se requiere una autorizacin previa para que su compaa de seguros cubra su medicamento, por favor permtanos de 1 a 2 das hbiles para completar este proceso.  Los precios de los medicamentos varan con frecuencia dependiendo del lugar de dnde se surte la receta y alguna farmacias pueden ofrecer precios ms baratos.  El sitio web www.goodrx.com tiene cupones para medicamentos de diferentes farmacias. Los precios aqu no tienen en cuenta lo que podra costar con la ayuda del seguro (puede ser ms barato con su seguro), pero el sitio web puede darle el precio si no utiliz ningn seguro.  - Puede imprimir el cupn correspondiente y llevarlo con su receta a la farmacia.  - Tambin puede pasar por nuestra oficina durante el horario de atencin regular y recoger una tarjeta de cupones de GoodRx.  -   Si necesita que su receta se enve electrnicamente a una farmacia diferente, informe a nuestra oficina a travs de MyChart de [place] o por telfono llamando al [phone] y presione la opcin 4.  

## 2022-09-20 NOTE — Progress Notes (Signed)
Follow-Up Visit   Subjective  Katie Berg is a 87 y.o. female who presents for the following: Total body skin exam (Hx of Dysplastic Nevus R mid medial buttocks, hx of AKs) and check spot (L abdomen, pt would like removed). The patient presents for Total-Body Skin Exam (TBSE) for skin cancer screening and mole check.  The patient has spots, moles and lesions to be evaluated, some may be new or changing and the patient has concerns that these could be cancer.   The following portions of the chart were reviewed this encounter and updated as appropriate:   Tobacco  Allergies  Meds  Problems  Med Hx  Surg Hx  Fam Hx     Review of Systems:  No other skin or systemic complaints except as noted in HPI or Assessment and Plan.  Objective  Well appearing patient in no apparent distress; mood and affect are within normal limits.  A full examination was performed including scalp, head, eyes, ears, nose, lips, neck, chest, axillae, abdomen, back, buttocks, bilateral upper extremities, bilateral lower extremities, hands, feet, fingers, toes, fingernails, and toenails. All findings within normal limits unless otherwise noted below.  face x 3 (3) Pink scaly macules  L epigastric x 2 (2) Stuck on waxy paps with erythema   Assessment & Plan   Lentigines - Scattered tan macules - Due to sun exposure - Benign-appearing, observe - Recommend daily broad spectrum sunscreen SPF 30+ to sun-exposed areas, reapply every 2 hours as needed. - Call for any changes - arms  Seborrheic Keratoses - Stuck-on, waxy, tan-brown papules and/or plaques  - Benign-appearing - Discussed benign etiology and prognosis. - Observe - Call for any changes - arms, trunk  Melanocytic Nevi - Tan-brown and/or pink-flesh-colored symmetric macules and papules - Benign appearing on exam today - Observation - Call clinic for new or changing moles - Recommend daily use of broad spectrum spf 30+ sunscreen to  sun-exposed areas.  - trunk  Hemangiomas - Red papules - Discussed benign nature - Observe - Call for any changes - trunk  Actinic Damage - Chronic condition, secondary to cumulative UV/sun exposure - diffuse scaly erythematous macules with underlying dyspigmentation - Recommend daily broad spectrum sunscreen SPF 30+ to sun-exposed areas, reapply every 2 hours as needed.  - Staying in the shade or wearing long sleeves, sun glasses (UVA+UVB protection) and wide brim hats (4-inch brim around the entire circumference of the hat) are also recommended for sun protection.  - Call for new or changing lesions.  Skin cancer screening performed today.   AK (actinic keratosis) (3) face x 3 Destruction of lesion - face x 3 Complexity: simple   Destruction method: cryotherapy   Informed consent: discussed and consent obtained   Timeout:  patient name, date of birth, surgical site, and procedure verified Lesion destroyed using liquid nitrogen: Yes   Region frozen until ice ball extended beyond lesion: Yes   Outcome: patient tolerated procedure well with no complications   Post-procedure details: wound care instructions given    Inflamed seborrheic keratosis (2) L epigastric x 2 Symptomatic, irritating, patient would like treated. Destruction of lesion - L epigastric x 2 Complexity: simple   Destruction method: cryotherapy   Informed consent: discussed and consent obtained   Timeout:  patient name, date of birth, surgical site, and procedure verified Lesion destroyed using liquid nitrogen: Yes   Region frozen until ice ball extended beyond lesion: Yes   Outcome: patient tolerated procedure well with no complications  Post-procedure details: wound care instructions given    History of Dysplastic Nevi - No evidence of recurrence today - Recommend regular full body skin exams - Recommend daily broad spectrum sunscreen SPF 30+ to sun-exposed areas, reapply every 2 hours as needed.  -  Call if any new or changing lesions are noted between office visits  - R mid medial buttocks  Return in about 1 year (around 09/21/2023) for TBSE, Hx of Dysplastic nevi, Hx of AKs.  I, Othelia Pulling, RMA, am acting as scribe for Sarina Ser, MD . Documentation: I have reviewed the above documentation for accuracy and completeness, and I agree with the above.  Sarina Ser, MD

## 2022-09-30 ENCOUNTER — Encounter: Payer: Self-pay | Admitting: Dermatology

## 2023-04-09 ENCOUNTER — Encounter: Payer: Self-pay | Admitting: Dermatology

## 2023-04-09 ENCOUNTER — Ambulatory Visit: Payer: Medicare PPO | Admitting: Dermatology

## 2023-04-09 VITALS — BP 113/63 | HR 82

## 2023-04-09 DIAGNOSIS — C4492 Squamous cell carcinoma of skin, unspecified: Secondary | ICD-10-CM

## 2023-04-09 DIAGNOSIS — D492 Neoplasm of unspecified behavior of bone, soft tissue, and skin: Secondary | ICD-10-CM

## 2023-04-09 DIAGNOSIS — L578 Other skin changes due to chronic exposure to nonionizing radiation: Secondary | ICD-10-CM | POA: Diagnosis not present

## 2023-04-09 DIAGNOSIS — C44622 Squamous cell carcinoma of skin of right upper limb, including shoulder: Secondary | ICD-10-CM

## 2023-04-09 HISTORY — DX: Squamous cell carcinoma of skin, unspecified: C44.92

## 2023-04-09 NOTE — Patient Instructions (Signed)
Wound Care Instructions  Cleanse wound gently with soap and water once a day then pat dry with clean gauze. Apply a thin coat of Petrolatum (petroleum jelly, "Vaseline") over the wound (unless you have an allergy to this). We recommend that you use a new, sterile tube of Vaseline. Do not pick or remove scabs. Do not remove the yellow or white "healing tissue" from the base of the wound.  Cover the wound with fresh, clean, nonstick gauze and secure with paper tape. You may use Band-Aids in place of gauze and tape if the wound is small enough, but would recommend trimming much of the tape off as there is often too much. Sometimes Band-Aids can irritate the skin.  You should call the office for your biopsy report after 1 week if you have not already been contacted.  If you experience any problems, such as abnormal amounts of bleeding, swelling, significant bruising, significant pain, or evidence of infection, please call the office immediately.  FOR ADULT SURGERY PATIENTS: If you need something for pain relief you may take 1 extra strength Tylenol (acetaminophen) AND 2 Ibuprofen (200mg each) together every 4 hours as needed for pain. (do not take these if you are allergic to them or if you have a reason you should not take them.) Typically, you may only need pain medication for 1 to 3 days.       Recommend daily broad spectrum sunscreen SPF 30+ to sun-exposed areas, reapply every 2 hours as needed. Call for new or changing lesions.  Staying in the shade or wearing long sleeves, sun glasses (UVA+UVB protection) and wide brim hats (4-inch brim around the entire circumference of the hat) are also recommended for sun protection.     Due to recent changes in healthcare laws, you may see results of your pathology and/or laboratory studies on MyChart before the doctors have had a chance to review them. We understand that in some cases there may be results that are confusing or concerning to you. Please  understand that not all results are received at the same time and often the doctors may need to interpret multiple results in order to provide you with the best plan of care or course of treatment. Therefore, we ask that you please give us 2 business days to thoroughly review all your results before contacting the office for clarification. Should we see a critical lab result, you will be contacted sooner.   If You Need Anything After Your Visit  If you have any questions or concerns for your doctor, please call our main line at [phone] and press option 4 to reach your doctor's medical assistant. If no one answers, please leave a voicemail as directed and we will return your call as soon as possible. Messages left after 4 pm will be answered the following business day.   You may also send us a message via MyChart. We typically respond to MyChart messages within 1-2 business days.  For prescription refills, please ask your pharmacy to contact our office. Our fax number is [phone].  If you have an urgent issue when the clinic is closed that cannot wait until the next business day, you can page your doctor at the number below.    Please note that while we do our best to be available for urgent issues outside of office hours, we are not available 24/7.   If you have an urgent issue and are unable to reach us, you may choose to seek medical care   at your doctor's office, retail clinic, urgent care center, or emergency room.  If you have a medical emergency, please immediately call 911 or go to the emergency department.  Pager Numbers  - Dr. Kowalski: [phone]  - Dr. Moye: [phone]  - Dr. Stewart: [phone]  In the event of inclement weather, please call our main line at [phone] for an update on the status of any delays or closures.  Dermatology Medication Tips: Please keep the boxes that topical medications come in in order to help keep track of the instructions about  where and how to use these. Pharmacies typically print the medication instructions only on the boxes and not directly on the medication tubes.   If your medication is too expensive, please contact our office at [phone] option 4 or send us a message through MyChart.   We are unable to tell what your co-pay for medications will be in advance as this is different depending on your insurance coverage. However, we may be able to find a substitute medication at lower cost or fill out paperwork to get insurance to cover a needed medication.   If a prior authorization is required to get your medication covered by your insurance company, please allow us 1-2 business days to complete this process.  Drug prices often vary depending on where the prescription is filled and some pharmacies may offer cheaper prices.  The website www.goodrx.com contains coupons for medications through different pharmacies. The prices here do not account for what the cost may be with help from insurance (it may be cheaper with your insurance), but the website can give you the price if you did not use any insurance.  - You can print the associated coupon and take it with your prescription to the pharmacy.  - You may also stop by our office during regular business hours and pick up a GoodRx coupon card.  - If you need your prescription sent electronically to a different pharmacy, notify our office through [facility] MyChart or by phone at [phone] option 4.     Si Usted Necesita Algo Despus de Su Visita  Tambin puede enviarnos un mensaje a travs de MyChart. Por lo general respondemos a los mensajes de MyChart en el transcurso de 1 a 2 das hbiles.  Para renovar recetas, por favor pida a su farmacia que se ponga en contacto con nuestra oficina. Nuestro nmero de fax es el [phone].  Si tiene un asunto urgente cuando la clnica est cerrada y que no puede esperar hasta el siguiente da hbil, puede  llamar/localizar a su doctor(a) al nmero que aparece a continuacin.   Por favor, tenga en cuenta que aunque hacemos todo lo posible para estar disponibles para asuntos urgentes fuera del horario de oficina, no estamos disponibles las 24 horas del da, los 7 das de la semana.   Si tiene un problema urgente y no puede comunicarse con nosotros, puede optar por buscar atencin mdica  en el consultorio de su doctor(a), en una clnica privada, en un centro de atencin urgente o en una sala de emergencias.  Si tiene una emergencia mdica, por favor llame inmediatamente al 911 o vaya a la sala de emergencias.  Nmeros de bper  - Dr. Kowalski: [phone]  - Dra. Moye: [phone]  - Dra. Stewart: [phone]  En caso de inclemencias del tiempo, por favor llame a nuestra lnea principal al [phone] para una actualizacin sobre el estado de cualquier retraso o cierre.  Consejos para la medicacin en   dermatologa: Por favor, guarde las cajas en las que vienen los medicamentos de uso tpico para ayudarle a seguir las instrucciones sobre dnde y cmo usarlos. Las farmacias generalmente imprimen las instrucciones del medicamento slo en las cajas y no directamente en los tubos del medicamento.   Si su medicamento es muy caro, por favor, pngase en contacto con nuestra oficina llamando al [phone] y presione la opcin 4 o envenos un mensaje a travs de MyChart.   No podemos decirle cul ser su copago por los medicamentos por adelantado ya que esto es diferente dependiendo de la cobertura de su seguro. Sin embargo, es posible que podamos encontrar un medicamento sustituto a menor costo o llenar un formulario para que el seguro cubra el medicamento que se considera necesario.   Si se requiere una autorizacin previa para que su compaa de seguros cubra su medicamento, por favor permtanos de 1 a 2 das hbiles para completar este proceso.  Los precios de los medicamentos varan con  frecuencia dependiendo del lugar de dnde se surte la receta y alguna farmacias pueden ofrecer precios ms baratos.  El sitio web www.goodrx.com tiene cupones para medicamentos de diferentes farmacias. Los precios aqu no tienen en cuenta lo que podra costar con la ayuda del seguro (puede ser ms barato con su seguro), pero el sitio web puede darle el precio si no utiliz ningn seguro.  - Puede imprimir el cupn correspondiente y llevarlo con su receta a la farmacia.  - Tambin puede pasar por nuestra oficina durante el horario de atencin regular y recoger una tarjeta de cupones de GoodRx.  - Si necesita que su receta se enve electrnicamente a una farmacia diferente, informe a nuestra oficina a travs de MyChart de [place] o por telfono llamando al [phone] y presione la opcin 4.  

## 2023-04-09 NOTE — Progress Notes (Signed)
   Follow-Up Visit   Subjective  Katie Berg is a 88 y.o. female who presents for the following: Spot on right dorsal hand. Unsure how long has been there. Painful if hit.   The patient has spots, moles and lesions to be evaluated, some may be new or changing and the patient has concerns that these could be cancer.    The following portions of the chart were reviewed this encounter and updated as appropriate: medications, allergies, medical history  Review of Systems:  No other skin or systemic complaints except as noted in HPI or Assessment and Plan.  Objective  Well appearing patient in no apparent distress; mood and affect are within normal limits.  A focused examination was performed of the following areas: Right hand   Relevant physical exam findings are noted in the Assessment and Plan.  Right Hand Dorsum 0.6 cm pink papule with central keratotic horn       Assessment & Plan   Neoplasm of skin Right Hand Dorsum  Epidermal / dermal shaving  Lesion diameter (cm):  0.6 Informed consent: discussed and consent obtained   Patient was prepped and draped in usual sterile fashion: Area prepped with alcohol. Anesthesia: the lesion was anesthetized in a standard fashion   Anesthetic:  1% lidocaine w/ epinephrine 1-100,000 buffered w/ 8.4% NaHCO3 Instrument used: flexible razor blade   Hemostasis achieved with: pressure, aluminum chloride and electrodesiccation   Outcome: patient tolerated procedure well    Destruction of lesion  Destruction method: electrodesiccation and curettage   Curettage performed in three different directions: Yes   Electrodesiccation performed over the curetted area: Yes   Final wound size (cm):  0.6 Hemostasis achieved with:  pressure, aluminum chloride and electrodesiccation Outcome: patient tolerated procedure well with no complications   Post-procedure details: wound care instructions given   Additional details:  Mupirocin ointment and  Bandaid applied   Specimen 1 - Surgical pathology Differential Diagnosis: Hypertrophic AK vs Wart,  R/O SCC  Check Margins: No   ACTINIC DAMAGE - chronic, secondary to cumulative UV radiation exposure/sun exposure over time - diffuse scaly erythematous macules with underlying dyspigmentation - Recommend daily broad spectrum sunscreen SPF 30+ to sun-exposed areas, reapply every 2 hours as needed.  - Recommend staying in the shade or wearing long sleeves, sun glasses (UVA+UVB protection) and wide brim hats (4-inch brim around the entire circumference of the hat). - Call for new or changing lesions.   Return for TBSE As Scheduled.  I, Lawson Radar, CMA, am acting as scribe for Willeen Niece, MD.   Documentation: I have reviewed the above documentation for accuracy and completeness, and I agree with the above.  Willeen Niece, MD

## 2023-04-15 ENCOUNTER — Telehealth: Payer: Self-pay

## 2023-04-15 NOTE — Telephone Encounter (Signed)
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

## 2023-04-15 NOTE — Telephone Encounter (Signed)
Advised pt of bx results/sh ?

## 2023-09-26 ENCOUNTER — Ambulatory Visit: Payer: Medicare PPO | Admitting: Dermatology

## 2023-09-26 DIAGNOSIS — L578 Other skin changes due to chronic exposure to nonionizing radiation: Secondary | ICD-10-CM | POA: Diagnosis not present

## 2023-09-26 DIAGNOSIS — L821 Other seborrheic keratosis: Secondary | ICD-10-CM

## 2023-09-26 DIAGNOSIS — W908XXA Exposure to other nonionizing radiation, initial encounter: Secondary | ICD-10-CM

## 2023-09-26 DIAGNOSIS — L814 Other melanin hyperpigmentation: Secondary | ICD-10-CM

## 2023-09-26 DIAGNOSIS — Z1283 Encounter for screening for malignant neoplasm of skin: Secondary | ICD-10-CM

## 2023-09-26 DIAGNOSIS — Z8589 Personal history of malignant neoplasm of other organs and systems: Secondary | ICD-10-CM

## 2023-09-26 DIAGNOSIS — Z86018 Personal history of other benign neoplasm: Secondary | ICD-10-CM

## 2023-09-26 DIAGNOSIS — Z872 Personal history of diseases of the skin and subcutaneous tissue: Secondary | ICD-10-CM

## 2023-09-26 DIAGNOSIS — D229 Melanocytic nevi, unspecified: Secondary | ICD-10-CM

## 2023-09-26 DIAGNOSIS — Z85828 Personal history of other malignant neoplasm of skin: Secondary | ICD-10-CM

## 2023-09-26 DIAGNOSIS — D1801 Hemangioma of skin and subcutaneous tissue: Secondary | ICD-10-CM

## 2023-09-26 NOTE — Progress Notes (Signed)
   Follow-Up Visit   Subjective  Nisaa Sucher is a 88 y.o. female who presents for the following: Skin Cancer Screening and Full Body Skin Exam  The patient presents for Total-Body Skin Exam (TBSE) for skin cancer screening and mole check. The patient has spots, moles and lesions to be evaluated, some may be new or changing and the patient may have concern these could be cancer. She has a history of SCC of the right hand dorsum, history of AKs.     The following portions of the chart were reviewed this encounter and updated as appropriate: medications, allergies, medical history  Review of Systems:  No other skin or systemic complaints except as noted in HPI or Assessment and Plan.  Objective  Well appearing patient in no apparent distress; mood and affect are within normal limits.  A full examination was performed including scalp, head, eyes, ears, nose, lips, neck, chest, axillae, abdomen, back, buttocks, bilateral upper extremities, bilateral lower extremities, hands, feet, fingers, toes, fingernails, and toenails. All findings within normal limits unless otherwise noted below.   Relevant physical exam findings are noted in the Assessment and Plan.    Assessment & Plan   SKIN CANCER SCREENING PERFORMED TODAY.  ACTINIC DAMAGE - Chronic condition, secondary to cumulative UV/sun exposure - diffuse scaly erythematous macules with underlying dyspigmentation - Recommend daily broad spectrum sunscreen SPF 30+ to sun-exposed areas, reapply every 2 hours as needed.  - Staying in the shade or wearing long sleeves, sun glasses (UVA+UVB protection) and wide brim hats (4-inch brim around the entire circumference of the hat) are also recommended for sun protection.  - Call for new or changing lesions.  LENTIGINES, SEBORRHEIC KERATOSES, HEMANGIOMAS - Benign normal skin lesions - Benign-appearing - Call for any changes  MELANOCYTIC NEVI - Tan-brown and/or pink-flesh-colored symmetric macules  and papules - Benign appearing on exam today - Observation - Call clinic for new or changing moles - Recommend daily use of broad spectrum spf 30+ sunscreen to sun-exposed areas.   History of Dysplastic Nevus Right mid med buttock, mild, 2015 - No evidence of recurrence today - Recommend regular full body skin exams - Recommend daily broad spectrum sunscreen SPF 30+ to sun-exposed areas, reapply every 2 hours as needed.  - Call if any new or changing lesions are noted between office visits  HISTORY OF SQUAMOUS CELL CARCINOMA OF THE SKIN Right hand dorsum, 04/09/2023 - No evidence of recurrence today - No lymphadenopathy - Recommend regular full body skin exams - Recommend daily broad spectrum sunscreen SPF 30+ to sun-exposed areas, reapply every 2 hours as needed.  - Call if any new or changing lesions are noted between office visits   Return in about 1 year (around 09/25/2024) for TBSE, Hx SCC, Hx Dysplastic Nevus.  Wendee Beavers, CMA, am acting as scribe for Armida Sans, MD .   Documentation: I have reviewed the above documentation for accuracy and completeness, and I agree with the above.  Armida Sans, MD

## 2023-09-26 NOTE — Patient Instructions (Signed)

## 2023-10-01 ENCOUNTER — Encounter: Payer: Self-pay | Admitting: Dermatology

## 2024-10-13 ENCOUNTER — Encounter: Payer: Self-pay | Admitting: Dermatology

## 2024-10-13 ENCOUNTER — Ambulatory Visit: Admitting: Dermatology

## 2024-10-13 DIAGNOSIS — L300 Nummular dermatitis: Secondary | ICD-10-CM | POA: Diagnosis not present

## 2024-10-13 DIAGNOSIS — Z79899 Other long term (current) drug therapy: Secondary | ICD-10-CM

## 2024-10-13 DIAGNOSIS — Z7189 Other specified counseling: Secondary | ICD-10-CM

## 2024-10-13 MED ORDER — CLOBETASOL PROPIONATE 0.05 % EX CREA: TOPICAL_CREAM | CUTANEOUS | 5 refills | Status: AC

## 2024-10-13 NOTE — Patient Instructions (Addendum)
 CeraVe over the counter cream:   Due to recent changes in healthcare laws, you may see results of your pathology and/or laboratory studies on MyChart before the doctors have had a chance to review them. We understand that in some cases there may be results that are confusing or concerning to you. Please understand that not all results are received at the same time and often the doctors may need to interpret multiple results in order to provide you with the best plan of care or course of treatment. Therefore, we ask that you please give us  2 business days to thoroughly review all your results before contacting the office for clarification. Should we see a critical lab result, you will be contacted sooner.   If You Need Anything After Your Visit  If you have any questions or concerns for your doctor, please call our main line at [phone] and press option 4 to reach your doctor's medical assistant. If no one answers, please leave a voicemail as directed and we will return your call as soon as possible. Messages left after 4 pm will be answered the following business day.   You may also send us  a message via MyChart. We typically respond to MyChart messages within 1-2 business days.  For prescription refills, please ask your pharmacy to contact our office. Our fax number is [phone].  If you have an urgent issue when the clinic is closed that cannot wait until the next business day, you can page your doctor at the number below.    Please note that while we do our best to be available for urgent issues outside of office hours, we are not available 24/7.   If you have an urgent issue and are unable to reach us , you may choose to seek medical care at your doctor's office, retail clinic, urgent care center, or emergency room.  If you have a medical emergency, please immediately call 911 or go to the emergency department.  Pager Numbers  - Dr. Hester: [phone]  - Dr. Jackquline:  [phone]  - Dr. Claudene: [phone]   - Dr. Raymund: [phone]  In the event of inclement weather, please call our main line at [phone] for an update on the status of any delays or closures.  Dermatology Medication Tips: Please keep the boxes that topical medications come in in order to help keep track of the instructions about where and how to use these. Pharmacies typically print the medication instructions only on the boxes and not directly on the medication tubes.   If your medication is too expensive, please contact our office at [phone] option 4 or send us  a message through MyChart.   We are unable to tell what your co-pay for medications will be in advance as this is different depending on your insurance coverage. However, we may be able to find a substitute medication at lower cost or fill out paperwork to get insurance to cover a needed medication.   If a prior authorization is required to get your medication covered by your insurance company, please allow us  1-2 business days to complete this process.  Drug prices often vary depending on where the prescription is filled and some pharmacies may offer cheaper prices.  The website www.goodrx.com contains coupons for medications through different pharmacies. The prices here do not account for what the cost may be with help from insurance (it may be cheaper with your insurance), but the website can give you the price if you did not use any  insurance.  - You can print the associated coupon and take it with your prescription to the pharmacy.  - You may also stop by our office during regular business hours and pick up a GoodRx coupon card.  - If you need your prescription sent electronically to a different pharmacy, notify our office through [facility] or by phone at [phone] option 4.     Si Usted Necesita Algo Despus de Su Visita  Tambin puede enviarnos un mensaje a travs de Clinical Cytogeneticist. Por lo general  respondemos a los mensajes de MyChart en el transcurso de 1 a 2 das hbiles.  Para renovar recetas, por favor pida a su farmacia que se ponga en contacto con nuestra oficina. Randi lakes de fax es [place] [phone].  Si tiene un asunto urgente cuando la clnica est cerrada y que no puede esperar hasta el siguiente da hbil, puede llamar/localizar a su doctor(a) al nmero que aparece a continuacin.   Por favor, tenga en cuenta que aunque hacemos todo lo posible para estar disponibles para asuntos urgentes fuera del horario de [place], no estamos disponibles las 24 horas del da, los 7 [address]  [address] de la [place].   Si tiene un problema urgente y no puede comunicarse con nosotros, puede optar por buscar atencin mdica  en el consultorio de su doctor(a), en una clnica privada, en un centro de atencin urgente o en una sala de emergencias.  Si tiene engineer, drilling, por favor llame inmediatamente al 911 o vaya a la sala de emergencias.  Nmeros de bper  - Dr. Hester: [phone]  - Dra. Jackquline: [phone]  - Dr. Claudene: [phone]  - Dra. Kitts: [phone]  En caso de inclemencias del [place], por favor llame a nuestra lnea principal al [phone] para una actualizacin sobre el estado de cualquier retraso o cierre.  Consejos para la medicacin en dermatologa: Por favor, guarde las cajas en las que vienen los medicamentos de uso tpico para ayudarle a seguir las instrucciones sobre dnde y cmo usarlos. Las farmacias generalmente imprimen las instrucciones del medicamento slo en las cajas y no directamente en los tubos del [place].   Si su medicamento es muy caro, por favor, pngase en contacto con landry rieger llamando al [phone] y presione la opcin 4 o envenos un mensaje a travs de Clinical Cytogeneticist.   No podemos decirle cul ser su copago por los medicamentos por adelantado ya que esto es diferente dependiendo de la cobertura de su seguro. Sin embargo, es posible  que podamos encontrar un medicamento sustituto a audiological scientist un formulario para que el seguro cubra el medicamento que se considera necesario.   Si se requiere una autorizacin previa para que su compaa de seguros cubra su medicamento, por favor permtanos de 1 a 2 das hbiles para completar este proceso.  Los precios de los medicamentos varan con frecuencia dependiendo del environmental consultant de dnde se surte la receta y alguna farmacias pueden ofrecer precios ms baratos.  El sitio web www.goodrx.com tiene cupones para medicamentos de health and safety inspector. Los precios aqu no tienen en cuenta lo que podra costar con la ayuda del seguro (puede ser ms barato con su seguro), pero el sitio web puede darle el precio si no utiliz tourist information centre manager.  - Puede imprimir el cupn correspondiente y llevarlo con su receta a la farmacia.  - Tambin puede pasar por nuestra oficina durante el horario de atencin regular y education officer, museum una tarjeta de cupones de GoodRx.  - Si necesita que su receta  se enve electrnicamente a una farmacia diferente, informe a nuestra oficina a travs de MyChart de [place] o por telfono llamando al [phone] y presione la opcin 4.

## 2024-10-13 NOTE — Progress Notes (Signed)
" ° °  Follow-Up Visit   Subjective  Katie Berg is a 89 y.o. female who presents for the following:  LOC on right ankle, left thigh, right elbow, and left back of ankle that tend to be itchy at the touch, no burning or bleeding. She does use an anti itch cream called no itch. Hx SCC, Hx Dysplastic Nevus.   The following portions of the chart were reviewed this encounter and updated as appropriate: medications, allergies, medical history  Review of Systems:  No other skin or systemic complaints except as noted in HPI or Assessment and Plan.  Objective  Well appearing patient in no apparent distress; mood and affect are within normal limits.  A focused examination was performed of the following areas: Lower and upper extremities   Relevant exam findings are noted in the Assessment and Plan.         Assessment & Plan  Nummular Dermatitis Exam: Pink scaly circular patches at right distal lower leg, left thigh, left lower leg anterior and posterior, right elbow lateral  Chronic and persistent condition with duration or expected duration over one year. Condition is symptomatic/ bothersome to patient. Not currently at goal.   Nummular dermatitis (eczema) is a chronic, relapsing, itchy rash that can significantly affect quality of life. It is often associated with dry skin and flares in the wintertime, and may require treatment with prescription topical anti-inflammatory medications, in addition to gentle skin care.  If there is associated atopic dermatitis and topicals are not working, then biologic injections may be necessary to clear rash and control symptoms.  Treatment Plan: Start clobetasol BID until smooth  Recommend mild soap and moisturizing cream after showers and multiple times daily.  Gentle skin care handout provided.    Follow up for Dupixent if not improved  NUMMULAR ECZEMA   COUNSELING AND COORDINATION OF CARE   MEDICATION MANAGEMENT    Return in about 1  month (around 11/13/2024) for Dermatitis w/ Dr. Claudene.  IAlmetta Nora, RMA, am acting as scribe for [name], MD .   Documentation: I have reviewed the above documentation for accuracy and completeness, and I agree with the above.  [name], MD    "
